# Patient Record
Sex: Male | Born: 1940 | Race: White | Hispanic: No | Marital: Married | Smoking: Never smoker
Health system: Southern US, Community
[De-identification: ages and names within clinical notes are randomized; demographics above are authoritative.]

## PROBLEM LIST (undated history)

## (undated) DIAGNOSIS — Z9889 Other specified postprocedural states: Secondary | ICD-10-CM

## (undated) DIAGNOSIS — R112 Nausea with vomiting, unspecified: Secondary | ICD-10-CM

## (undated) DIAGNOSIS — I10 Essential (primary) hypertension: Secondary | ICD-10-CM

## (undated) DIAGNOSIS — S0531XA Ocular laceration without prolapse or loss of intraocular tissue, right eye, initial encounter: Secondary | ICD-10-CM

## (undated) DIAGNOSIS — N189 Chronic kidney disease, unspecified: Secondary | ICD-10-CM

## (undated) DIAGNOSIS — I219 Acute myocardial infarction, unspecified: Secondary | ICD-10-CM

## (undated) DIAGNOSIS — I2510 Atherosclerotic heart disease of native coronary artery without angina pectoris: Secondary | ICD-10-CM

## (undated) DIAGNOSIS — J449 Chronic obstructive pulmonary disease, unspecified: Secondary | ICD-10-CM

## (undated) DIAGNOSIS — E785 Hyperlipidemia, unspecified: Secondary | ICD-10-CM

## (undated) DIAGNOSIS — E039 Hypothyroidism, unspecified: Secondary | ICD-10-CM

## (undated) HISTORY — PX: CORNEAL LACERATION REPAIR: SHX5331

## (undated) HISTORY — PX: TONSILLECTOMY: SUR1361

## (undated) HISTORY — PX: OTHER SURGICAL HISTORY: SHX169

## (undated) HISTORY — PX: CARDIAC CATHETERIZATION: SHX172

## (undated) HISTORY — PX: COLONOSCOPY: SHX174

## (undated) HISTORY — PX: CAROTID ENDARTERECTOMY: SUR193

## (undated) HISTORY — PX: EYE SURGERY: SHX253

---

## 2004-09-24 ENCOUNTER — Ambulatory Visit: Payer: Self-pay | Admitting: Specialist

## 2007-07-31 ENCOUNTER — Ambulatory Visit: Payer: Self-pay | Admitting: Gastroenterology

## 2009-10-07 ENCOUNTER — Emergency Department: Payer: Self-pay | Admitting: Emergency Medicine

## 2011-08-04 ENCOUNTER — Ambulatory Visit: Payer: Self-pay | Admitting: Internal Medicine

## 2013-08-03 IMAGING — US US EXTREM LOW VENOUS*L*
1 series · 17 of 24 positions shown · non-contrast
Comparison: none

REASON FOR EXAM: STAT CR [id number] lower extr pain swelling  left leg
pt has been traveling
COMMENTS:

PROCEDURE:     US  - US DOPPLER LOW EXTR LEFT  - August 04, 2011  [DATE]
RESULT:     Comparison: None

[Series 1: us extrem low venous*left* · 17 of 41 slices shown]
[im 1/41]
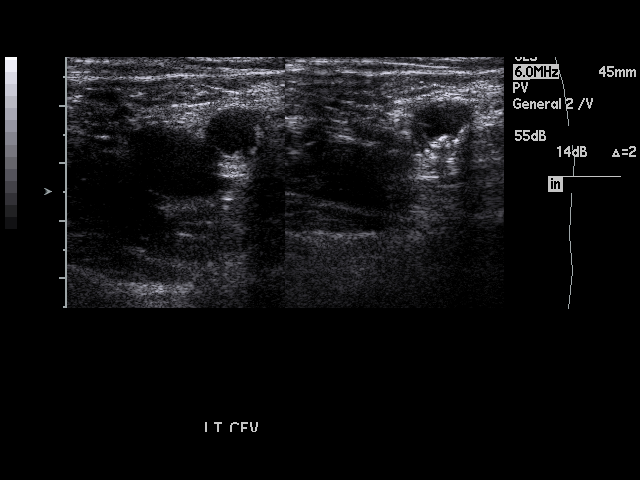
[im 4/41]
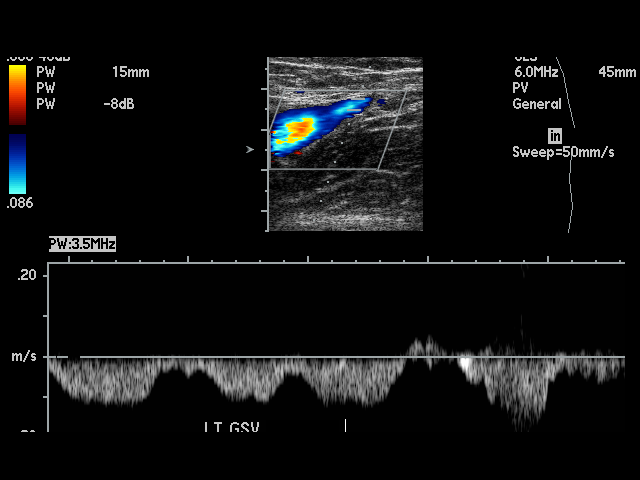
[im 6/41]
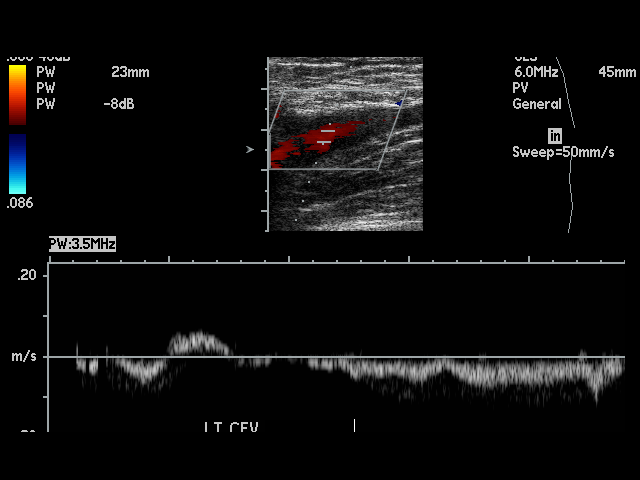
[im 7/41]
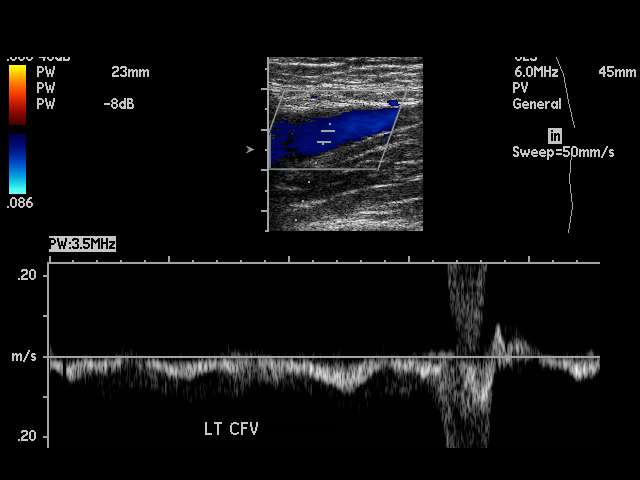
[im 11/41]
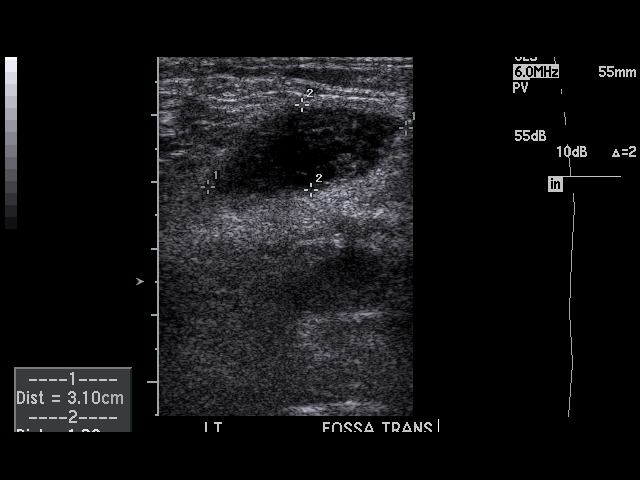
[im 13/41]
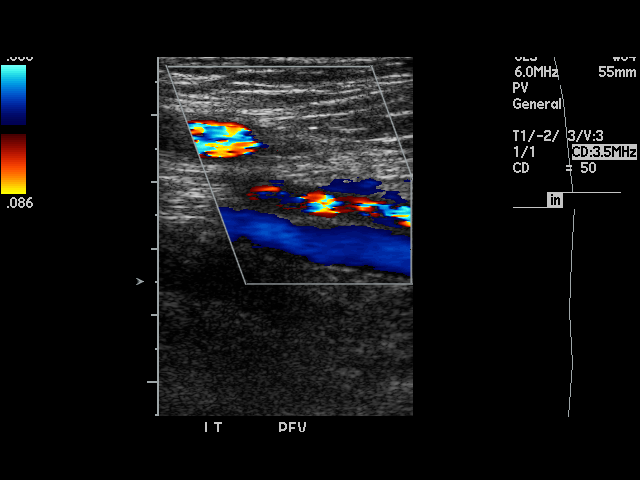
[im 16/41]
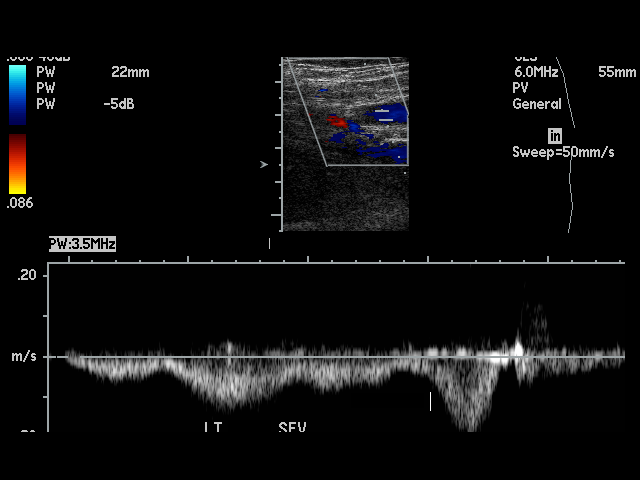
[im 18/41]
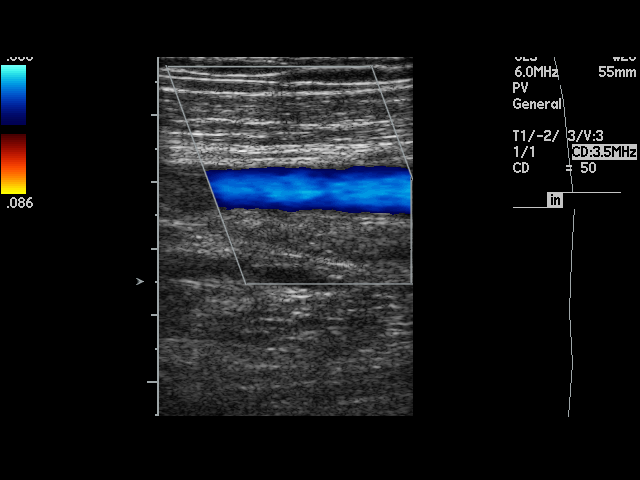
[im 21/41]
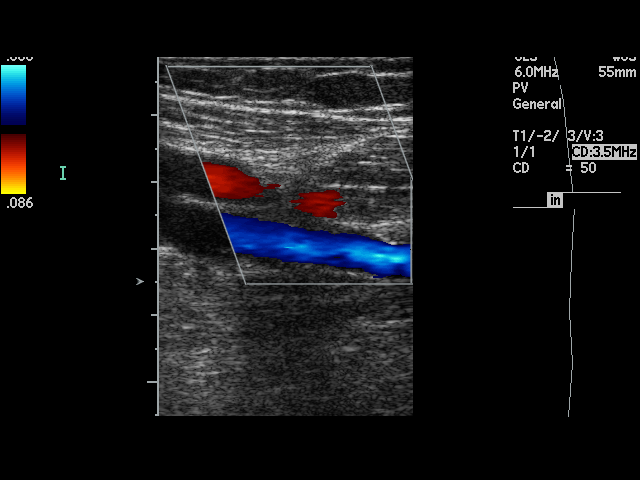
[im 23/41]
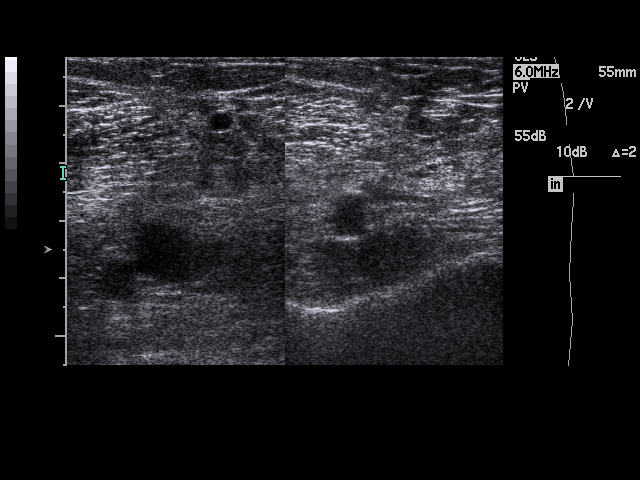
[im 25/41]
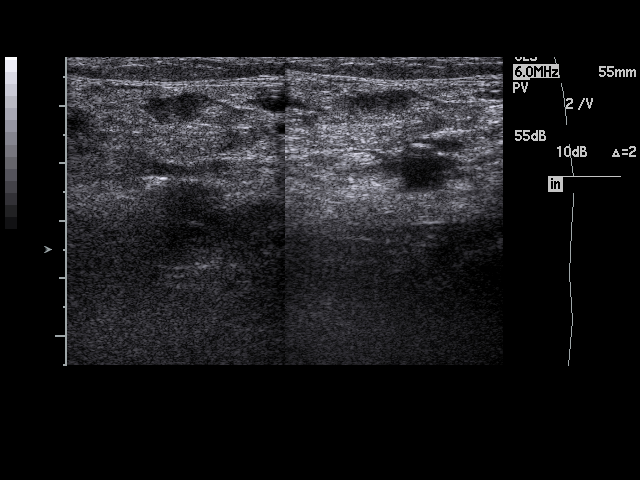
[im 28/41]
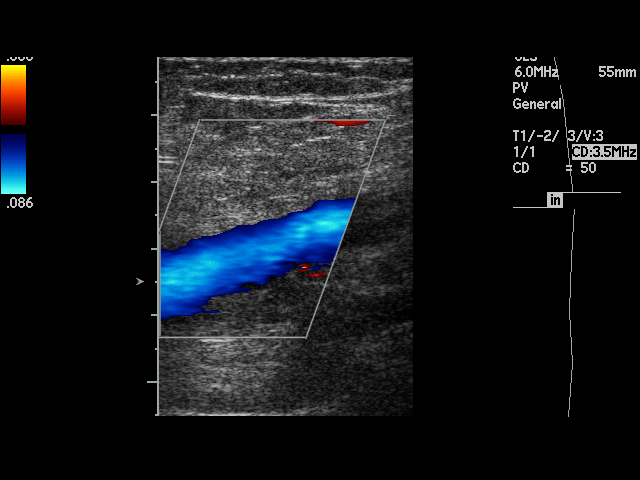
[im 30/41]
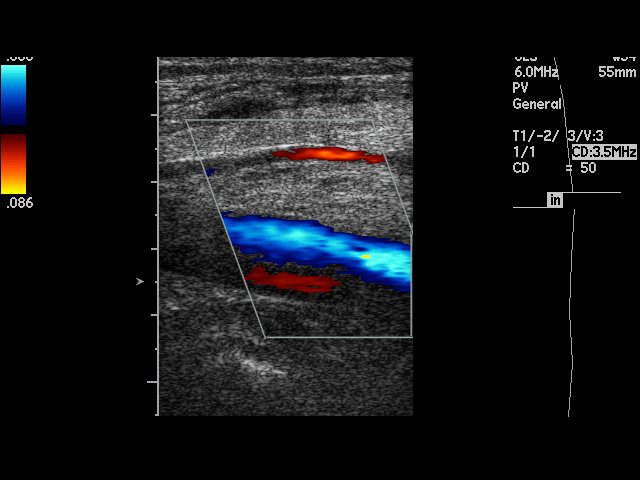
[im 34/41]
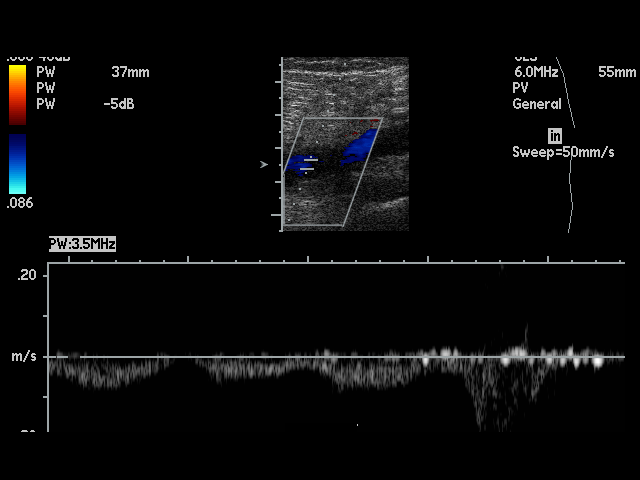
[im 35/41]
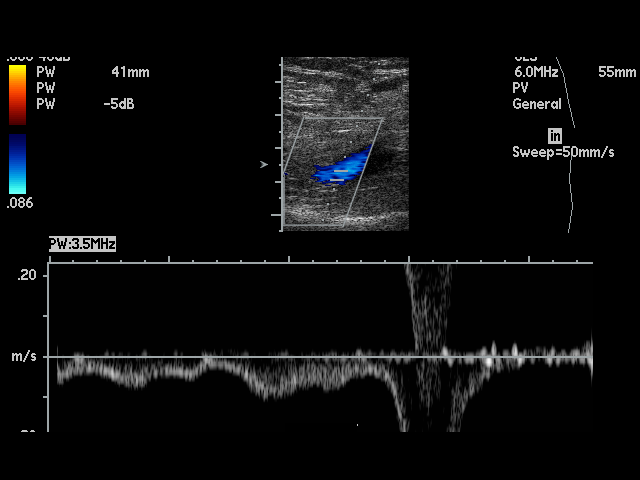
[im 37/41]
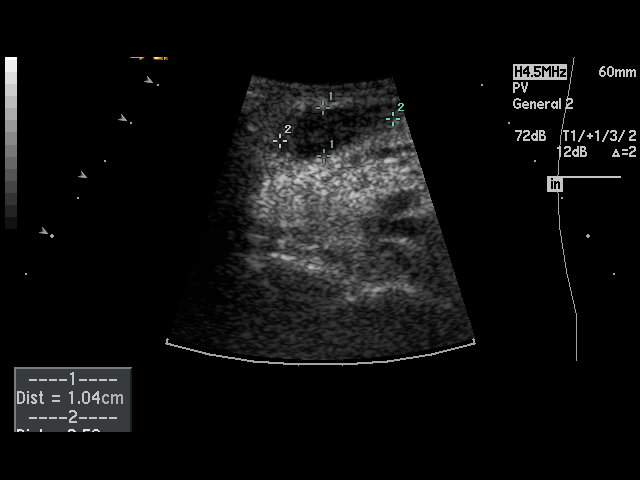
[im 41/41]
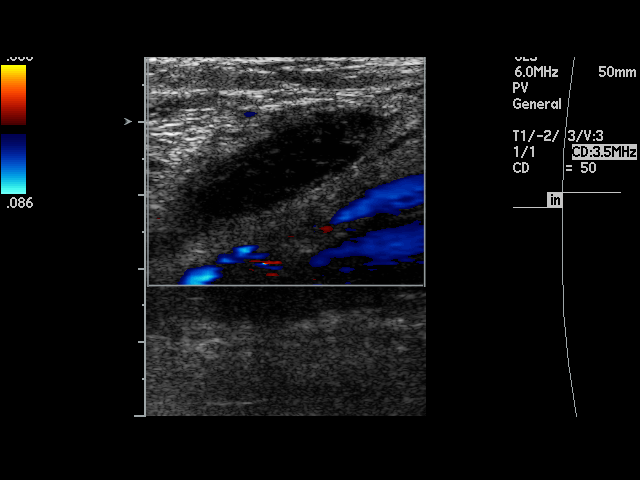

[17 of 24 positions shown; findings below may reference images not displayed]

FINDINGS: Multiple longitudinal and transverse gray-scale as well as color
and spectral Doppler images of the left lower extremity veins were obtained
from the common femoral veins through the popliteal veins.

The left common femoral, greater saphenous, femoral, popliteal veins, and
venous trifurcation are patent, demonstrating normal color-flow and
compressibility. No intraluminal thrombus is identified.There is normal
respiratory variation and augmentation demonstrated at all vein levels.

There is a medial left popliteal fossa cystic mass measuring 6.8 x 1 x
cm with no internal Doppler flow most consistent with a Baker's cyst.
IMPRESSION: No evidence of DVT in the left lower extremity.

Baker's cyst.

## 2015-10-28 DIAGNOSIS — H35351 Cystoid macular degeneration, right eye: Secondary | ICD-10-CM | POA: Diagnosis not present

## 2015-10-28 DIAGNOSIS — S0531XS Ocular laceration without prolapse or loss of intraocular tissue, right eye, sequela: Secondary | ICD-10-CM | POA: Diagnosis not present

## 2015-10-29 DIAGNOSIS — I10 Essential (primary) hypertension: Secondary | ICD-10-CM | POA: Diagnosis not present

## 2015-10-29 DIAGNOSIS — E034 Atrophy of thyroid (acquired): Secondary | ICD-10-CM | POA: Diagnosis not present

## 2015-11-05 DIAGNOSIS — Z0001 Encounter for general adult medical examination with abnormal findings: Secondary | ICD-10-CM | POA: Diagnosis not present

## 2015-11-05 DIAGNOSIS — Z125 Encounter for screening for malignant neoplasm of prostate: Secondary | ICD-10-CM | POA: Diagnosis not present

## 2015-11-05 DIAGNOSIS — I701 Atherosclerosis of renal artery: Secondary | ICD-10-CM | POA: Diagnosis not present

## 2015-11-05 DIAGNOSIS — J431 Panlobular emphysema: Secondary | ICD-10-CM | POA: Diagnosis not present

## 2015-11-05 DIAGNOSIS — I2510 Atherosclerotic heart disease of native coronary artery without angina pectoris: Secondary | ICD-10-CM | POA: Diagnosis not present

## 2015-11-05 DIAGNOSIS — E782 Mixed hyperlipidemia: Secondary | ICD-10-CM | POA: Diagnosis not present

## 2015-11-05 DIAGNOSIS — H4311 Vitreous hemorrhage, right eye: Secondary | ICD-10-CM | POA: Diagnosis not present

## 2015-11-05 DIAGNOSIS — E034 Atrophy of thyroid (acquired): Secondary | ICD-10-CM | POA: Diagnosis not present

## 2015-11-05 DIAGNOSIS — I10 Essential (primary) hypertension: Secondary | ICD-10-CM | POA: Diagnosis not present

## 2016-01-13 DIAGNOSIS — S0531XS Ocular laceration without prolapse or loss of intraocular tissue, right eye, sequela: Secondary | ICD-10-CM | POA: Diagnosis not present

## 2016-01-13 DIAGNOSIS — Z961 Presence of intraocular lens: Secondary | ICD-10-CM | POA: Diagnosis not present

## 2016-01-13 DIAGNOSIS — H35351 Cystoid macular degeneration, right eye: Secondary | ICD-10-CM | POA: Diagnosis not present

## 2016-01-13 DIAGNOSIS — H268 Other specified cataract: Secondary | ICD-10-CM | POA: Diagnosis not present

## 2016-02-24 DIAGNOSIS — E782 Mixed hyperlipidemia: Secondary | ICD-10-CM | POA: Diagnosis not present

## 2016-02-24 DIAGNOSIS — I2510 Atherosclerotic heart disease of native coronary artery without angina pectoris: Secondary | ICD-10-CM | POA: Diagnosis not present

## 2016-02-24 DIAGNOSIS — I679 Cerebrovascular disease, unspecified: Secondary | ICD-10-CM | POA: Diagnosis not present

## 2016-02-24 DIAGNOSIS — I10 Essential (primary) hypertension: Secondary | ICD-10-CM | POA: Diagnosis not present

## 2016-04-08 DIAGNOSIS — K4090 Unilateral inguinal hernia, without obstruction or gangrene, not specified as recurrent: Secondary | ICD-10-CM | POA: Diagnosis not present

## 2016-04-26 DIAGNOSIS — K4090 Unilateral inguinal hernia, without obstruction or gangrene, not specified as recurrent: Secondary | ICD-10-CM | POA: Diagnosis not present

## 2016-05-10 DIAGNOSIS — K4090 Unilateral inguinal hernia, without obstruction or gangrene, not specified as recurrent: Secondary | ICD-10-CM | POA: Diagnosis not present

## 2016-05-14 DIAGNOSIS — E034 Atrophy of thyroid (acquired): Secondary | ICD-10-CM | POA: Diagnosis not present

## 2016-05-14 DIAGNOSIS — I10 Essential (primary) hypertension: Secondary | ICD-10-CM | POA: Diagnosis not present

## 2016-05-14 DIAGNOSIS — I2510 Atherosclerotic heart disease of native coronary artery without angina pectoris: Secondary | ICD-10-CM | POA: Diagnosis not present

## 2016-05-14 DIAGNOSIS — J431 Panlobular emphysema: Secondary | ICD-10-CM | POA: Diagnosis not present

## 2016-05-20 ENCOUNTER — Encounter
Admission: RE | Admit: 2016-05-20 | Discharge: 2016-05-20 | Disposition: A | Discharge status: Home / Self Care | Payer: Commercial Managed Care - HMO | Source: Ambulatory Visit | Attending: Surgery | Admitting: Surgery

## 2016-05-20 DIAGNOSIS — Z01818 Encounter for other preprocedural examination: Secondary | ICD-10-CM | POA: Insufficient documentation

## 2016-05-20 HISTORY — DX: Atherosclerotic heart disease of native coronary artery without angina pectoris: I25.10

## 2016-05-20 HISTORY — DX: Essential (primary) hypertension: I10

## 2016-05-20 HISTORY — DX: Hypothyroidism, unspecified: E03.9

## 2016-05-20 HISTORY — DX: Nausea with vomiting, unspecified: R11.2

## 2016-05-20 HISTORY — DX: Ocular laceration without prolapse or loss of intraocular tissue, right eye, initial encounter: S05.31XA

## 2016-05-20 HISTORY — DX: Hyperlipidemia, unspecified: E78.5

## 2016-05-20 HISTORY — DX: Other specified postprocedural states: Z98.890

## 2016-05-20 HISTORY — DX: Chronic kidney disease, unspecified: N18.9

## 2016-05-20 HISTORY — DX: Acute myocardial infarction, unspecified: I21.9

## 2016-05-20 HISTORY — DX: Chronic obstructive pulmonary disease, unspecified: J44.9

## 2016-05-20 NOTE — Patient Instructions (Signed)
  Your procedure is scheduled [id number] 05/28/16 Report to Day Surgery. To find out your arrival time please call [phone] between 1PM - 3PM on Thurs. 05/27/16.  Remember: Instructions that are not followed completely may result in serious medical risk, up to and including death, or upon the discretion of your surgeon and anesthesiologist your surgery may need to be rescheduled.    __x__ 1. Do not eat food or drink liquids after midnight. No gum chewing or hard candies.     __x__ 2. No Alcohol for 24 hours before or after surgery.   ____ 3. Do Not Smoke For 24 Hours Prior to Your Surgery.   ____ 4. Bring all medications with you on the day of surgery if instructed.    __x__ 5. Notify your doctor if there is any change in your medical condition     (cold, fever, infections).       Do not wear jewelry, make-up, hairpins, clips or nail polish.  Do not wear lotions, powders, or perfumes. You may wear deodorant.  Do not shave 48 hours prior to surgery. Men may shave face and neck.  Do not bring valuables to the hospital.    [facility] is not responsible for any belongings or valuables.               Contacts, dentures or bridgework may not be worn into surgery.  Leave your suitcase in the car. After surgery it may be brought to your room.  For patients admitted to the hospital, discharge time is determined by your                treatment team.   Patients discharged the day of surgery will not be allowed to drive home.   Please read over the following fact sheets that you were given:      ____ Take these medicines the morning of surgery with A SIP OF WATER:    1.   2.   3.   4.  5.  6.  ____ Fleet Enema (as directed)   __x__ Use CHG Soap as directed  ____ Use inhalers on the day of surgery  ____ Stop metformin 2 days prior to surgery    ____ Take 1/2 of usual insulin dose the night before surgery and none on the morning of surgery.   __x__ Stop aspirin on  05/21/16  _x__ Stop Anti-inflammatories on tomorrow then only tylenol until after surgery   ____ Stop supplements until after surgery.    ____ Bring C-Pap to the hospital.

## 2016-05-20 NOTE — Pre-Procedure Instructions (Signed)
Dr. Neita Garnet office notified by fax that a cardiac clearance is needed.

## 2016-05-24 NOTE — Pre-Procedure Instructions (Signed)
CLEARED MOD RISK GIVEN KNOWN CAD /CAROTID DISEASE BY DR Mila Merry

## 2016-05-28 ENCOUNTER — Encounter
Admission: RE | Disposition: A | Discharge status: Home / Self Care | Payer: Self-pay | Source: Ambulatory Visit | Attending: Surgery

## 2016-05-28 ENCOUNTER — Ambulatory Visit
Admission: RE | Admit: 2016-05-28 | Discharge: 2016-05-28 | Disposition: A | Discharge status: Home / Self Care | Payer: Commercial Managed Care - HMO | Source: Ambulatory Visit | Attending: Surgery | Admitting: Surgery

## 2016-05-28 ENCOUNTER — Ambulatory Visit: Payer: Commercial Managed Care - HMO | Admitting: Certified Registered"

## 2016-05-28 ENCOUNTER — Encounter: Payer: Self-pay | Admitting: *Deleted

## 2016-05-28 DIAGNOSIS — I2510 Atherosclerotic heart disease of native coronary artery without angina pectoris: Secondary | ICD-10-CM | POA: Insufficient documentation

## 2016-05-28 DIAGNOSIS — D176 Benign lipomatous neoplasm of spermatic cord: Secondary | ICD-10-CM | POA: Diagnosis not present

## 2016-05-28 DIAGNOSIS — I252 Old myocardial infarction: Secondary | ICD-10-CM | POA: Insufficient documentation

## 2016-05-28 DIAGNOSIS — Z79899 Other long term (current) drug therapy: Secondary | ICD-10-CM | POA: Insufficient documentation

## 2016-05-28 DIAGNOSIS — E785 Hyperlipidemia, unspecified: Secondary | ICD-10-CM | POA: Diagnosis not present

## 2016-05-28 DIAGNOSIS — E039 Hypothyroidism, unspecified: Secondary | ICD-10-CM | POA: Insufficient documentation

## 2016-05-28 DIAGNOSIS — Z7982 Long term (current) use of aspirin: Secondary | ICD-10-CM | POA: Diagnosis not present

## 2016-05-28 DIAGNOSIS — I129 Hypertensive chronic kidney disease with stage 1 through stage 4 chronic kidney disease, or unspecified chronic kidney disease: Secondary | ICD-10-CM | POA: Insufficient documentation

## 2016-05-28 DIAGNOSIS — N189 Chronic kidney disease, unspecified: Secondary | ICD-10-CM | POA: Diagnosis not present

## 2016-05-28 DIAGNOSIS — K4090 Unilateral inguinal hernia, without obstruction or gangrene, not specified as recurrent: Secondary | ICD-10-CM | POA: Insufficient documentation

## 2016-05-28 DIAGNOSIS — J449 Chronic obstructive pulmonary disease, unspecified: Secondary | ICD-10-CM | POA: Diagnosis not present

## 2016-05-28 HISTORY — PX: INGUINAL HERNIA REPAIR: SHX194

## 2016-05-28 SURGERY — REPAIR, HERNIA, INGUINAL, ADULT
Anesthesia: General | Laterality: Right | Wound class: Clean

## 2016-05-28 MED ORDER — FENTANYL CITRATE (PF) 100 MCG/2ML IJ SOLN
INTRAMUSCULAR | Status: DC | PRN
  Administered 2016-05-28: 100 ug via INTRAVENOUS

## 2016-05-28 MED ORDER — FAMOTIDINE 20 MG PO TABS
20.0000 mg | ORAL_TABLET | Freq: Once | ORAL | Status: AC
Start: 2016-05-28 — End: 2016-05-28
  Administered 2016-05-28: 20 mg via ORAL

## 2016-05-28 MED ORDER — ONDANSETRON HCL 4 MG/2ML IJ SOLN: 4.0000 mg | Freq: Once | INTRAMUSCULAR | Status: DC | PRN

## 2016-05-28 MED ORDER — BUPIVACAINE-EPINEPHRINE (PF) 0.5% -1:200000 IJ SOLN
INTRAMUSCULAR | Status: AC
  Filled 2016-05-28: qty 30

## 2016-05-28 MED ORDER — DEXAMETHASONE SODIUM PHOSPHATE 10 MG/ML IJ SOLN
INTRAMUSCULAR | Status: DC | PRN
  Administered 2016-05-28: 5 mg via INTRAVENOUS

## 2016-05-28 MED ORDER — GLYCOPYRROLATE 0.2 MG/ML IJ SOLN
INTRAMUSCULAR | Status: DC | PRN
  Administered 2016-05-28: 0.2 mg via INTRAVENOUS

## 2016-05-28 MED ORDER — FAMOTIDINE 20 MG PO TABS
ORAL_TABLET | ORAL | Status: AC
  Filled 2016-05-28: qty 1

## 2016-05-28 MED ORDER — ONDANSETRON HCL 4 MG/2ML IJ SOLN
INTRAMUSCULAR | Status: DC | PRN
  Administered 2016-05-28: 4 mg via INTRAVENOUS

## 2016-05-28 MED ORDER — FENTANYL CITRATE (PF) 100 MCG/2ML IJ SOLN: 25.0000 ug | INTRAMUSCULAR | Status: DC | PRN

## 2016-05-28 MED ORDER — ROCURONIUM BROMIDE 100 MG/10ML IV SOLN
INTRAVENOUS | Status: DC | PRN
  Administered 2016-05-28: 50 mg via INTRAVENOUS
  Administered 2016-05-28: 10 mg via INTRAVENOUS

## 2016-05-28 MED ORDER — CEFAZOLIN SODIUM-DEXTROSE 2-4 GM/100ML-% IV SOLN
INTRAVENOUS | Status: AC
Start: 2016-05-28 — End: 2016-05-28
  Filled 2016-05-28: qty 100

## 2016-05-28 MED ORDER — LACTATED RINGERS IV SOLN
INTRAVENOUS | Status: DC | PRN
  Administered 2016-05-28: 08:00:00 via INTRAVENOUS

## 2016-05-28 MED ORDER — 0.9 % SODIUM CHLORIDE (POUR BTL) OPTIME
TOPICAL | Status: DC | PRN
  Administered 2016-05-28: 1000 mL

## 2016-05-28 MED ORDER — BUPIVACAINE-EPINEPHRINE (PF) 0.5% -1:200000 IJ SOLN
INTRAMUSCULAR | Status: DC | PRN
  Administered 2016-05-28: 17 mL via PERINEURAL

## 2016-05-28 MED ORDER — EPHEDRINE SULFATE 50 MG/ML IJ SOLN
INTRAMUSCULAR | Status: DC | PRN
  Administered 2016-05-28 (×2): 10 mg via INTRAVENOUS

## 2016-05-28 MED ORDER — CEFAZOLIN SODIUM-DEXTROSE 2-4 GM/100ML-% IV SOLN
2.0000 g | Freq: Once | INTRAVENOUS | Status: AC
  Administered 2016-05-28: 2 g via INTRAVENOUS

## 2016-05-28 MED ORDER — LACTATED RINGERS IV SOLN
INTRAVENOUS | Status: DC
  Administered 2016-05-28: 07:00:00 via INTRAVENOUS

## 2016-05-28 MED ORDER — LIDOCAINE HCL (CARDIAC) 20 MG/ML IV SOLN
INTRAVENOUS | Status: DC | PRN
  Administered 2016-05-28: 50 mg via INTRAVENOUS

## 2016-05-28 MED ORDER — HYDROCODONE-ACETAMINOPHEN 5-325 MG PO TABS: 1.0000 | ORAL_TABLET | ORAL | Status: DC | PRN

## 2016-05-28 MED ORDER — SUGAMMADEX SODIUM 200 MG/2ML IV SOLN
INTRAVENOUS | Status: DC | PRN
  Administered 2016-05-28: 154.2 mg via INTRAVENOUS

## 2016-05-28 MED ORDER — HYDROCODONE-ACETAMINOPHEN 5-325 MG PO TABS: 1.0000 | ORAL_TABLET | ORAL | 0 refills | Status: AC | PRN

## 2016-05-28 MED ORDER — PROPOFOL 10 MG/ML IV BOLUS
INTRAVENOUS | Status: DC | PRN
  Administered 2016-05-28: 150 mg via INTRAVENOUS

## 2016-05-28 SURGICAL SUPPLY — 28 items
BLADE SURG 15 STRL LF DISP TIS (BLADE) ×1 IMPLANT
BLADE SURG 15 STRL SS (BLADE) ×2
CANISTER SUCT 1200ML W/VALVE (MISCELLANEOUS) ×3 IMPLANT
CHLORAPREP W/TINT 26ML (MISCELLANEOUS) ×6 IMPLANT
DRAIN PENROSE 5/8X18 LTX STRL (WOUND CARE) ×3 IMPLANT
DRAPE LAPAROTOMY 77X122 PED (DRAPES) ×3 IMPLANT
ELECT REM PT RETURN 9FT ADLT (ELECTROSURGICAL) ×3
ELECTRODE REM PT RTRN 9FT ADLT (ELECTROSURGICAL) ×1 IMPLANT
GLOVE BIO SURGEON STRL SZ7 (GLOVE) ×6 IMPLANT
GLOVE BIO SURGEON STRL SZ7.5 (GLOVE) ×3 IMPLANT
GLOVE INDICATOR 6.5 STRL GRN (GLOVE) ×6 IMPLANT
GLOVE INDICATOR 7.5 STRL GRN (GLOVE) ×6 IMPLANT
GOWN STRL REUS W/ TWL LRG LVL3 (GOWN DISPOSABLE) ×3 IMPLANT
GOWN STRL REUS W/TWL LRG LVL3 (GOWN DISPOSABLE) ×6
KIT RM TURNOVER STRD PROC AR (KITS) ×3 IMPLANT
LABEL OR SOLS (LABEL) ×3 IMPLANT
LIQUID BAND (GAUZE/BANDAGES/DRESSINGS) ×3 IMPLANT
MESH SYNTHETIC 4X6 SOFT BARD (Mesh General) ×1 IMPLANT
MESH SYNTHETIC SOFT BARD 4X6 (Mesh General) ×2 IMPLANT
NEEDLE HYPO 25X1 1.5 SAFETY (NEEDLE) ×3 IMPLANT
NS IRRIG 500ML POUR BTL (IV SOLUTION) ×3 IMPLANT
PACK BASIN MINOR ARMC (MISCELLANEOUS) ×3 IMPLANT
SUT CHROMIC 4 0 RB 1X27 (SUTURE) ×3 IMPLANT
SUT MNCRL AB 4-0 PS2 18 (SUTURE) ×3 IMPLANT
SUT SURGILON 0 30 BLK (SUTURE) ×9 IMPLANT
SUT VIC AB 4-0 SH 27 (SUTURE) ×4
SUT VIC AB 4-0 SH 27XANBCTRL (SUTURE) ×2 IMPLANT
SYRINGE 10CC LL (SYRINGE) ×3 IMPLANT

## 2016-05-28 NOTE — Transfer of Care (Signed)
Immediate Anesthesia Transfer of Care Note  Patient: Johnathan Joyce  Procedure(s) Performed: Procedure(s): HERNIA REPAIR INGUINAL ADULT (Right)  Patient Location: PACU  Anesthesia Type:General  Level of Consciousness: patient cooperative  Airway & Oxygen Therapy: Patient Spontanous Breathing  Post-op Assessment: Report given to RN  Post vital signs: stable  Last Vitals:  Vitals:   05/28/16 0609 05/28/16 0925  BP: (!) 151/87 131/74  Pulse: (!) 56 70  Resp: 14   Temp: 36.4 C 36.7 C    Last Pain:  Vitals:   05/28/16 0609  TempSrc: Tympanic         Complications: No apparent anesthesia complications

## 2016-05-28 NOTE — Op Note (Signed)
OPERATIVE REPORT  PREOPERATIVE DIAGNOSIS: right inguinal hernia  POSTOPERATIVE DIAGNOSIS:right  inguinal hernia  PROCEDURE:  right inguinal hernia repair  ANESTHESIA:  General  SURGEON:  Rochel Brome M.D.  INDICATIONS: He reports bulging and moderate discomfort in the right groin. A right inguinal hernia was demonstrated on physical exam.  With the patient on the operating table in the supine position the right lower quadrant was prepared with clippers and with ChloraPrep and draped in a sterile manner. A transversely oriented suprapubic incision was made and carried down through subcutaneous tissues. Electrocautery was used for hemostasis. The Scarpa's fascia was incised. The external oblique aponeurosis was incised along the course of its fibers to open the external ring and expose the inguinal cord structures. The cord structures were mobilized. A Penrose drain was passed around the cord structures for traction. There was a cord lipoma which was dissected free from surrounding structures and high ligation was done at the internal ring with 4-0 Vicryl suture ligature. The lipoma was amputated and was not submitted for pathology. There was a direct inguinal hernia. The attenuated transversalis fascia was incised circumferentially and removed. A small bleeding point was ligated with 4-0 Vicryl. The repair was carried out with 0 Surgilon beginning at the pubic tubercle and suturing the conjoined tendon to the shelving edge of the inguinal ligament incorporating transversalis fascia into the repair. The last stitch led to satisfactory narrowing of the internal ring. Bard soft mesh was cut to create an oval shape and was placed over the repair. This was sutured to the repair with interrupted 0 Surgilon sutures and also sutured medially to the deep fascia and on both sides of the internal ring. Next after seeing hemostasis was intact the cord structures were replaced along the floor of the inguinal canal.  The cut edges of the external oblique aponeurosis were closed with a running 4-0 Vicryl suture to re-create the external ring. The deep fascia superior and lateral to the repair site was infiltrated with half percent Sensorcaine with epinephrine. Subcutaneous tissues were also infiltrated. The Scarpa's fascia was closed with interrupted 4-0 Vicryl sutures. The skin was closed with running 4-0 Monocryl subcuticular suture and LiquiBand. The testicle remained in the scrotum  The patient appeared to be in satisfactory condition and was prepared for transfer to the recovery room.  Rochel Brome M.D.

## 2016-05-28 NOTE — Anesthesia Preprocedure Evaluation (Signed)
Anesthesia Evaluation  Patient identified by MRN, date of birth, ID band Patient awake    Reviewed: Allergy & Precautions, NPO status , Patient's Chart, lab work & pertinent test results  History of Anesthesia Complications (+) PONV and history of anesthetic complications  Airway Mallampati: II  TM Distance: >3 FB     Dental  (+) Chipped   Pulmonary COPD,    Pulmonary exam normal        Cardiovascular hypertension, Pt. on medications + CAD and + Past MI  Normal cardiovascular exam     Neuro/Psych negative neurological ROS  negative psych ROS   GI/Hepatic negative GI ROS, Neg liver ROS,   Endo/Other  Hypothyroidism   Renal/GU Renal InsufficiencyRenal disease  negative genitourinary   Musculoskeletal negative musculoskeletal ROS (+)   Abdominal Normal abdominal exam  (+)   Peds negative pediatric ROS (+)  Hematology negative hematology ROS (+)   Anesthesia Other Findings   Reproductive/Obstetrics                             Anesthesia Physical Anesthesia Plan  ASA: III  Anesthesia Plan: General   Post-op Pain Management:    Induction: Intravenous  Airway Management Planned: Oral ETT  Additional Equipment:   Intra-op Plan:   Post-operative Plan: Extubation in OR  Informed Consent: I have reviewed the patients History and Physical, chart, labs and discussed the procedure including the risks, benefits and alternatives for the proposed anesthesia with the patient or authorized representative who has indicated his/her understanding and acceptance.   Dental advisory given  Plan Discussed with: CRNA and Surgeon  Anesthesia Plan Comments:         Anesthesia Quick Evaluation

## 2016-05-28 NOTE — Anesthesia Postprocedure Evaluation (Signed)
Anesthesia Post Note  Patient: Johnathan Joyce  Procedure(s) Performed: Procedure(s) (LRB): HERNIA REPAIR INGUINAL ADULT (Right)  Patient location during evaluation: PACU Anesthesia Type: General Level of consciousness: awake and alert and oriented Pain management: pain level controlled Vital Signs Assessment: post-procedure vital signs reviewed and stable Respiratory status: spontaneous breathing Cardiovascular status: blood pressure returned to baseline Anesthetic complications: no    Last Vitals:  Vitals:   05/28/16 1020 05/28/16 1304  BP: (!) 145/72 (!) 143/75  Pulse: (!) 54 63  Resp: 16 16  Temp: 36.6 C     Last Pain:  Vitals:   05/28/16 1304  TempSrc:   PainSc: 1                  Cindee Mclester

## 2016-05-28 NOTE — Anesthesia Procedure Notes (Signed)
Procedure Name: Intubation Date/Time: 05/28/2016 8:04 AM Performed by: Timoteo Expose Pre-anesthesia Checklist: Patient identified, Emergency Drugs available, Suction available, Patient being monitored and Timeout performed Patient Re-evaluated:Patient Re-evaluated prior to inductionOxygen Delivery Method: Circle system utilized Intubation Type: IV induction Ventilation: Mask ventilation without difficulty Laryngoscope Size: 4 and Mac Grade View: Grade II Tube type: Oral Tube size: 7.5 mm Number of attempts: 1 Airway Equipment and Method: Stylet

## 2016-05-28 NOTE — H&P (Signed)
Johnathan Joyce is an 75 y.o. male.   Chief Complaint: A bulge in the right groin HPI: He reports he recently had been doing some strenuous activities and noticed a bulge in the right groin. He has had no pain at the site. He was evaluated in the office and found to have a right inguinal hernia and surgery was recommended for definitive treatment. He has discontinued his aspirin for 1 week preop.  Past Medical History:  Diagnosis Date  . Chronic kidney disease    renal stenosis  . COPD (chronic obstructive pulmonary disease) ([place])   . Coronary artery disease   . Hyperlipidemia   . Hypertension   . Hypothyroidism   . Myocardial infarction ([place])   . PONV (postoperative nausea and vomiting)    agitation, combative after eye surgery  . Ruptured globe of right eye     Past Surgical History:  Procedure Laterality Date  . CARDIAC CATHETERIZATION     stent  . CAROTID ENDARTERECTOMY    . cataract Right   . COLONOSCOPY    . CORNEAL LACERATION REPAIR Right   . EYE SURGERY    . left thumb Left   . TONSILLECTOMY      History reviewed. No pertinent family history. Social History:  reports that he has never smoked. He has never used smokeless tobacco. He reports that he does not drink alcohol or use drugs.  Allergies: No Known Allergies  Medications Prior to Admission  Medication Sig Dispense Refill  . amLODipine (NORVASC) 5 MG tablet Take 5 mg by mouth at bedtime.    Marland Kitchen levothyroxine (SYNTHROID, LEVOTHROID) 75 MCG tablet Take 75 mcg by mouth at bedtime.    Marland Kitchen lisinopril (PRINIVIL,ZESTRIL) 5 MG tablet Take 5 mg by mouth at bedtime.    . rosuvastatin (CRESTOR) 40 MG tablet Take 40 mg by mouth at bedtime.    Marland Kitchen aspirin 325 MG tablet Take 325 mg by mouth at bedtime.      ROS he reports no recent acute illness such as cough cold or sore throat. He reports no difficulty breathing, no chest pain. Review of systems otherwise negative  Blood pressure (!) 151/87, pulse (!) 56, temperature 97.6  F (36.4 C), temperature source Tympanic, resp. rate 14, SpO2 100 %. Physical Exam  GENERAL:  Awake alert and oriented and in no acute distress.  HEENT:  Head is normocephalic.  On the right side it is irregular with evidence of iridectomy. The pupil on the left side is round and reactive to light..  Extraocular movements are intact. Sclera is clear.  Pharynx is clear.  LUNGS:  Clear without rales rhonchi or wheezes.  HEART:  Regular rhythm S1-S2, without murmur.  ABDOMEN: Soft flat and nontender the hernia is currently reduced. The right side was marked YES  CLINICAL DATA: Recent metabolic panel was with a creatinine of 0.9. CBC in March with hemoglobin of 15.6 and platelet count 167,000  Assessment/Plan Right inguinal hernia  I discussed the plan for right inguinal hernia repair  Rochel Brome, MD 05/28/2016, 7:28 AM

## 2016-05-28 NOTE — Discharge Instructions (Addendum)
AMBULATORY SURGERY  DISCHARGE INSTRUCTIONS   1) The drugs that you were given will stay in your system until tomorrow so for the next 24 hours you should not:  A) Drive an automobile B) Make any legal decisions C) Drink any alcoholic beverage   2) You may resume regular meals tomorrow.  Today it is better to start with liquids and gradually work up to solid foods.  You may eat anything you prefer, but it is better to start with liquids, then soup and crackers, and gradually work up to solid foods.   3) Please notify your doctor immediately if you have any unusual bleeding, trouble breathing, redness and pain at the surgery site, drainage, fever, or pain not relieved by medication.    4) Additional Instructions:        Please contact your physician with any problems or Same Day Surgery at [phone], Monday through Friday 6 am to 4 pm, or [place] at [facility] number at [phone].  Take Tylenol or Norco if needed for pain.  Should not drive or do anything dangerous when taking Norco.  May shower.  Resume aspirin on Saturday.  Avoid straining and heavy lifting for 1 month.

## 2016-06-07 ENCOUNTER — Encounter: Payer: Self-pay | Admitting: Surgery

## 2016-09-21 DIAGNOSIS — S0531XS Ocular laceration without prolapse or loss of intraocular tissue, right eye, sequela: Secondary | ICD-10-CM | POA: Diagnosis not present

## 2016-09-21 DIAGNOSIS — H4311 Vitreous hemorrhage, right eye: Secondary | ICD-10-CM | POA: Diagnosis not present

## 2016-09-21 DIAGNOSIS — Z961 Presence of intraocular lens: Secondary | ICD-10-CM | POA: Diagnosis not present

## 2016-09-21 DIAGNOSIS — H35351 Cystoid macular degeneration, right eye: Secondary | ICD-10-CM | POA: Diagnosis not present

## 2016-09-21 DIAGNOSIS — H2512 Age-related nuclear cataract, left eye: Secondary | ICD-10-CM | POA: Diagnosis not present

## 2016-10-20 ENCOUNTER — Encounter: Payer: Self-pay | Admitting: Surgery

## 2016-11-12 DIAGNOSIS — E034 Atrophy of thyroid (acquired): Secondary | ICD-10-CM | POA: Diagnosis not present

## 2016-11-17 DIAGNOSIS — E034 Atrophy of thyroid (acquired): Secondary | ICD-10-CM | POA: Diagnosis not present

## 2016-11-17 DIAGNOSIS — I10 Essential (primary) hypertension: Secondary | ICD-10-CM | POA: Diagnosis not present

## 2016-11-17 DIAGNOSIS — E782 Mixed hyperlipidemia: Secondary | ICD-10-CM | POA: Diagnosis not present

## 2016-11-17 DIAGNOSIS — I2510 Atherosclerotic heart disease of native coronary artery without angina pectoris: Secondary | ICD-10-CM | POA: Diagnosis not present

## 2016-11-17 DIAGNOSIS — Z0001 Encounter for general adult medical examination with abnormal findings: Secondary | ICD-10-CM | POA: Diagnosis not present

## 2016-11-17 DIAGNOSIS — I701 Atherosclerosis of renal artery: Secondary | ICD-10-CM | POA: Diagnosis not present

## 2016-11-17 DIAGNOSIS — J431 Panlobular emphysema: Secondary | ICD-10-CM | POA: Diagnosis not present

## 2016-11-17 DIAGNOSIS — I679 Cerebrovascular disease, unspecified: Secondary | ICD-10-CM | POA: Diagnosis not present

## 2016-12-14 DIAGNOSIS — I499 Cardiac arrhythmia, unspecified: Secondary | ICD-10-CM | POA: Diagnosis not present

## 2016-12-20 DIAGNOSIS — I499 Cardiac arrhythmia, unspecified: Secondary | ICD-10-CM | POA: Diagnosis not present

## 2016-12-23 DIAGNOSIS — I499 Cardiac arrhythmia, unspecified: Secondary | ICD-10-CM | POA: Diagnosis not present
